# Patient Record
Sex: Female | Born: 1960 | Race: Black or African American | Hispanic: No | Marital: Married | Smoking: Never smoker
Health system: Southern US, Community
[De-identification: ages and names within clinical notes are randomized; demographics above are authoritative.]

## PROBLEM LIST (undated history)

## (undated) DIAGNOSIS — E785 Hyperlipidemia, unspecified: Secondary | ICD-10-CM

## (undated) DIAGNOSIS — E7800 Pure hypercholesterolemia, unspecified: Secondary | ICD-10-CM

## (undated) DIAGNOSIS — I10 Essential (primary) hypertension: Secondary | ICD-10-CM

## (undated) HISTORY — DX: Pure hypercholesterolemia, unspecified: E78.00

## (undated) HISTORY — PX: NASAL SEPTUM SURGERY: SHX37

## (undated) HISTORY — PX: BREAST BIOPSY: SHX20

## (undated) HISTORY — DX: Essential (primary) hypertension: I10

## (undated) HISTORY — DX: Hyperlipidemia, unspecified: E78.5

---

## 1998-06-14 ENCOUNTER — Other Ambulatory Visit: Admission: RE | Admit: 1998-06-14 | Discharge: 1998-06-14 | Payer: Self-pay | Admitting: Obstetrics and Gynecology

## 1998-06-17 ENCOUNTER — Other Ambulatory Visit: Admission: RE | Admit: 1998-06-17 | Discharge: 1998-06-17 | Payer: Self-pay | Admitting: Obstetrics and Gynecology

## 2000-01-20 ENCOUNTER — Encounter: Admission: RE | Admit: 2000-01-20 | Discharge: 2000-04-19 | Payer: Self-pay | Admitting: Internal Medicine

## 2000-02-09 ENCOUNTER — Other Ambulatory Visit: Admission: RE | Admit: 2000-02-09 | Discharge: 2000-02-09 | Payer: Self-pay | Admitting: Obstetrics and Gynecology

## 2000-03-31 ENCOUNTER — Ambulatory Visit (HOSPITAL_COMMUNITY): Admission: RE | Admit: 2000-03-31 | Discharge: 2000-03-31 | Payer: Self-pay | Admitting: Gastroenterology

## 2000-03-31 ENCOUNTER — Encounter (INDEPENDENT_AMBULATORY_CARE_PROVIDER_SITE_OTHER): Payer: Self-pay | Admitting: Specialist

## 2000-04-06 ENCOUNTER — Ambulatory Visit (HOSPITAL_COMMUNITY): Admission: RE | Admit: 2000-04-06 | Discharge: 2000-04-06 | Payer: Self-pay | Admitting: Gastroenterology

## 2000-04-06 ENCOUNTER — Encounter: Payer: Self-pay | Admitting: Gastroenterology

## 2000-05-20 ENCOUNTER — Encounter: Payer: Self-pay | Admitting: Obstetrics and Gynecology

## 2000-05-20 ENCOUNTER — Ambulatory Visit (HOSPITAL_COMMUNITY): Admission: RE | Admit: 2000-05-20 | Discharge: 2000-05-20 | Payer: Self-pay | Admitting: Obstetrics and Gynecology

## 2000-12-28 ENCOUNTER — Other Ambulatory Visit: Admission: RE | Admit: 2000-12-28 | Discharge: 2000-12-28 | Payer: Self-pay | Admitting: Obstetrics and Gynecology

## 2001-01-12 ENCOUNTER — Encounter: Payer: Self-pay | Admitting: Internal Medicine

## 2001-01-12 ENCOUNTER — Encounter: Admission: RE | Admit: 2001-01-12 | Discharge: 2001-01-12 | Payer: Self-pay | Admitting: Internal Medicine

## 2001-10-10 ENCOUNTER — Other Ambulatory Visit: Admission: RE | Admit: 2001-10-10 | Discharge: 2001-10-10 | Payer: Self-pay | Admitting: Obstetrics and Gynecology

## 2001-11-18 ENCOUNTER — Inpatient Hospital Stay (HOSPITAL_COMMUNITY): Admission: AD | Admit: 2001-11-18 | Discharge: 2001-11-18 | Payer: Self-pay | Admitting: Obstetrics and Gynecology

## 2001-11-18 ENCOUNTER — Encounter: Payer: Self-pay | Admitting: Obstetrics and Gynecology

## 2002-04-04 ENCOUNTER — Inpatient Hospital Stay (HOSPITAL_COMMUNITY): Admission: AD | Admit: 2002-04-04 | Discharge: 2002-04-06 | Payer: Self-pay | Admitting: Obstetrics & Gynecology

## 2002-04-06 ENCOUNTER — Encounter: Payer: Self-pay | Admitting: Obstetrics and Gynecology

## 2002-05-08 ENCOUNTER — Inpatient Hospital Stay (HOSPITAL_COMMUNITY): Admission: AD | Admit: 2002-05-08 | Discharge: 2002-05-11 | Payer: Self-pay | Admitting: *Deleted

## 2002-05-16 ENCOUNTER — Encounter: Admission: RE | Admit: 2002-05-16 | Discharge: 2002-06-15 | Payer: Self-pay | Admitting: Obstetrics and Gynecology

## 2002-06-15 ENCOUNTER — Other Ambulatory Visit: Admission: RE | Admit: 2002-06-15 | Discharge: 2002-06-15 | Payer: Self-pay | Admitting: Obstetrics and Gynecology

## 2002-07-16 ENCOUNTER — Encounter: Admission: RE | Admit: 2002-07-16 | Discharge: 2002-08-15 | Payer: Self-pay | Admitting: Obstetrics and Gynecology

## 2002-08-16 ENCOUNTER — Encounter: Admission: RE | Admit: 2002-08-16 | Discharge: 2002-09-15 | Payer: Self-pay | Admitting: Obstetrics and Gynecology

## 2002-10-15 ENCOUNTER — Encounter: Admission: RE | Admit: 2002-10-15 | Discharge: 2002-11-14 | Payer: Self-pay | Admitting: Obstetrics and Gynecology

## 2002-12-15 ENCOUNTER — Encounter: Admission: RE | Admit: 2002-12-15 | Discharge: 2003-01-14 | Payer: Self-pay | Admitting: Obstetrics and Gynecology

## 2003-01-12 ENCOUNTER — Encounter: Payer: Self-pay | Admitting: Internal Medicine

## 2003-01-12 ENCOUNTER — Encounter: Admission: RE | Admit: 2003-01-12 | Discharge: 2003-01-12 | Payer: Self-pay | Admitting: Internal Medicine

## 2003-02-14 ENCOUNTER — Encounter: Admission: RE | Admit: 2003-02-14 | Discharge: 2003-03-16 | Payer: Self-pay | Admitting: Obstetrics and Gynecology

## 2003-09-14 ENCOUNTER — Other Ambulatory Visit: Admission: RE | Admit: 2003-09-14 | Discharge: 2003-09-14 | Payer: Self-pay | Admitting: Obstetrics and Gynecology

## 2003-10-12 ENCOUNTER — Ambulatory Visit (HOSPITAL_COMMUNITY): Admission: RE | Admit: 2003-10-12 | Discharge: 2003-10-12 | Payer: Self-pay | Admitting: Obstetrics and Gynecology

## 2004-10-29 ENCOUNTER — Ambulatory Visit (HOSPITAL_COMMUNITY): Admission: RE | Admit: 2004-10-29 | Discharge: 2004-10-29 | Payer: Self-pay | Admitting: Internal Medicine

## 2004-11-05 ENCOUNTER — Encounter: Admission: RE | Admit: 2004-11-05 | Discharge: 2004-11-05 | Payer: Self-pay | Admitting: Obstetrics and Gynecology

## 2005-05-06 ENCOUNTER — Encounter: Admission: RE | Admit: 2005-05-06 | Discharge: 2005-05-06 | Payer: Self-pay | Admitting: Obstetrics and Gynecology

## 2005-11-04 ENCOUNTER — Encounter: Admission: RE | Admit: 2005-11-04 | Discharge: 2005-11-04 | Payer: Self-pay | Admitting: Obstetrics and Gynecology

## 2005-12-03 ENCOUNTER — Encounter: Admission: RE | Admit: 2005-12-03 | Discharge: 2005-12-03 | Payer: Self-pay | Admitting: Obstetrics and Gynecology

## 2006-04-15 ENCOUNTER — Ambulatory Visit (HOSPITAL_COMMUNITY): Admission: RE | Admit: 2006-04-15 | Discharge: 2006-04-15 | Payer: Self-pay | Admitting: Orthopedic Surgery

## 2007-01-06 ENCOUNTER — Encounter: Admission: RE | Admit: 2007-01-06 | Discharge: 2007-01-06 | Payer: Self-pay | Admitting: Obstetrics and Gynecology

## 2008-01-19 ENCOUNTER — Encounter: Admission: RE | Admit: 2008-01-19 | Discharge: 2008-01-19 | Payer: Self-pay | Admitting: Internal Medicine

## 2008-01-30 ENCOUNTER — Encounter: Admission: RE | Admit: 2008-01-30 | Discharge: 2008-01-30 | Payer: Self-pay | Admitting: Internal Medicine

## 2008-08-22 ENCOUNTER — Encounter: Admission: RE | Admit: 2008-08-22 | Discharge: 2008-08-22 | Payer: Self-pay | Admitting: Internal Medicine

## 2009-01-07 ENCOUNTER — Encounter: Admission: RE | Admit: 2009-01-07 | Discharge: 2009-01-07 | Payer: Self-pay | Admitting: Internal Medicine

## 2009-12-31 ENCOUNTER — Ambulatory Visit (HOSPITAL_COMMUNITY): Admission: RE | Admit: 2009-12-31 | Discharge: 2009-12-31 | Payer: Self-pay | Admitting: Internal Medicine

## 2010-01-09 ENCOUNTER — Encounter: Admission: RE | Admit: 2010-01-09 | Discharge: 2010-01-09 | Payer: Self-pay | Admitting: Internal Medicine

## 2010-07-13 HISTORY — PX: SALIVARY GLAND SURGERY: SHX768

## 2010-11-15 ENCOUNTER — Inpatient Hospital Stay (INDEPENDENT_AMBULATORY_CARE_PROVIDER_SITE_OTHER)
Admission: RE | Admit: 2010-11-15 | Discharge: 2010-11-15 | Disposition: A | Discharge status: Home / Self Care | Payer: BC Managed Care – PPO | Source: Ambulatory Visit | Attending: Family Medicine | Admitting: Family Medicine

## 2010-11-15 DIAGNOSIS — L259 Unspecified contact dermatitis, unspecified cause: Secondary | ICD-10-CM

## 2010-11-28 NOTE — Discharge Summary (Signed)
NAMEVERA, FURNISS                        ACCOUNT NO.:  [id number]   MEDICAL RECORD NO.:  [id number]                   PATIENT TYPE:  INP   LOCATION:  9154                                 FACILITY:  [facility]   PHYSICIAN:  Maxie Better, MD              DATE OF BIRTH:  09-16-1960   DATE OF ADMISSION:  04/04/2002  DATE OF DISCHARGE:  04/06/2002                                 DISCHARGE SUMMARY   ADMISSION DIAGNOSES:  1. Oligohydramnios.  2. Chronic hypertension.  3. Intrauterine gestation at 41 and five-sevenths weeks.   DISCHARGE DIAGNOSES:  1. Oligohydramnios, resolved.  2. Chronic hypertension.  3. Intrauterine gestation at 35 weeks.   HISTORY OF PRESENT ILLNESS:  A 50 year old gravida 1 para 0 female at 75 and  five-sevenths weeks gestation by last menstrual period and ultrasound who is  now at 34 and five-sevenths weeks gestation admitted for aggressive IV  hydration secondary to oligohydramnios.  The patient had had no leakage of  fluid.  She has had good fetal movements, no contractions.  Chronic  hypertension has been controlled with Aldomet.  Ultrasound done secondary to  her hypertension on April 04, 2002 showed amniotic fluid index of 5.9  with was less than the third percentile, estimated fetal weight of 2173 g.  Biophysical profile was 6/8, -2 for fluid.  Prenatal course was notable for  fetal pyelectasis which had subsequently resolved on ultrasound done on  March 10, 2002.  The patient had declined AFP3 testing as well as  amniocentesis.  The patient is known to have a posterior left 6.1 x 6.1 cm  fibroid.   HOSPITAL COURSE:  The patient was admitted.  Her exam was unremarkable.  Her  blood pressure on admission was 107/76, cervix was long and closed,  presenting part was high.  Her group B strep culture was done.  Tracing  showed a baseline fetal heart rate of 160 with accelerations, no  contractions.  She was started on IV fluids, placed on continuous  fetal  monitoring.  She was continued on her Aldomet.  Her group B strep culture  was unavailable.  The patient had a subsequent reactive nonstress test.  On  hospital day #3 she underwent a repeat ultrasound that showed an aminiotic  fluid index of 10.8 which was normal.  Therefore, the patient was discharged  home.   DISPOSITION:  Home; condition stable.   DISCHARGE MEDICATIONS:  1. Continue Aldomet 250 p.o. b.i.d.  2. Prenatal vitamins one p.o. q.d.  3. Zantac 150 mg one p.o. b.i.d.   FOLLOW-UP:  As previously scheduled appointment.    DISCHARGE INSTRUCTIONS:  Call for leakage of fluid, decreased fetal  movement, preterm labor precautions, and vaginal bleeding.  Maxie Better, MD    [place]/MEDQ  D:  04/30/2002  T:  05/01/2002  Job:  [id number]

## 2010-11-28 NOTE — Discharge Summary (Signed)
NAMEVICKEE, Theresa Rhodes                        ACCOUNT NO.:  [id number]   MEDICAL RECORD NO.:  [id number]                   PATIENT TYPE:  INP   LOCATION:  9127                                 [facility]   PHYSICIAN:  Maxie Better, M.D.            DATE OF BIRTH:  02/28/1961   DATE OF ADMISSION:  05/08/2002  DATE OF DISCHARGE:  05/11/2002                                 DISCHARGE SUMMARY   ADMISSION DIAGNOSES:  1. Chronic hypertension.  2. Term gestation.   DISCHARGE DIAGNOSES:  1. Term gestation, delivered.  2. Chronic hypertension.   HISTORY OF PRESENT ILLNESS:  This is a 50 year old gravida 1, para 0, female  with chronic hypertension, admitted for induction of labor, secondary to her  hypertension.  The patient is well-controlled on Aldomet.  Her prenatal care  was complicated by a 48-hour admission for IV hydration, secondary to  oligohydramnios which subsequently resolved.  She had been followed closely  with intrapartum fetal surveillance with non-stress test.   HOSPITAL COURSE:  The patient was admitted, examination was done, and she  had a reactive non-stress test.  The cervix was closed, soft, vertex, -3.  She was noted to be Group B Strep culture positive.  She was continued on  her Aldomet.  Cervidil was placed on 05/08/02, for cervical ripening, and on  the morning of 05/09/02, after removal of the Cervidil, the patient was  found to be 1.5 cm, 60 to 70%, -2, vertex presentation.  Penicillin  prophylaxis was started, as was Pitocin.  The patient subsequently received  an epidural for pain management.  Artifical rupture of membranes was  performed around 3:15 p.m., clear fluid was noted.  Intrauterine pressure  catheter and internal scalp electrodes were placed.  Variable decelerations  were noted with contractions.  Pitocin was discontinued.  Amnio infusion was  started.  The variables were thought to be secondary to cord compression.  She subsequently  progressed quickly with the pushing resulted in variable  decelerations down to the 60's with each contraction.  She was fully dilated  posterior station.  Vacuum was applied for assistance in delivery.  This  resulted in the delivery of a 5 pound 15 ounce live female over a second  degree perineal laceration with a loop of  cord around the right leg.  Apgar's of 9 and 9.  Cord pH was done.  The postoperative course was  unremarkable.  CBC on postpartum day #1, showed a hematocrit of 35.3,  hemoglobin of 12, white blood cell count of 10.9, platelets of 169,000.  The  patient's blood type is A positive.  She was rubella immune.   DISPOSITION:  Home.   CONDITION ON DISCHARGE:  Stable.   DISCHARGE MEDICATIONS:  1. Aldomet 250 mg p.o. b.i.d.  2. Prenatal vitamins one p.o. q.d.  3. Non-steroidal's p.r.n.   FOLLOWUP:  Four weeks postpartum at [facility].   DISCHARGE INSTRUCTIONS:  1. Call for temperature greater then or equal to 100.4.  2. Nothing per vagina for four to six weeks.  3. Call for preeclampsia warning signs, soaking a regular pad over a hour     more frequently.  4. Continue prenatal vitamins.                                                 Maxie Better, M.D.    [place]/MEDQ  D:  05/29/2002  T:  05/30/2002  Job:  [id number]

## 2010-11-28 NOTE — Procedures (Signed)
[facility]  Patient:    Theresa Rhodes, Theresa Rhodes               MRN: [id number] Proc. Date: 03/31/00 Adm. Date:  [id number] Disc. Date: [id number] Attending:  Charna Elizabeth CC:         Norva Pavlov, M.D.   Procedure Report  DATE OF BIRTH:  February 25, 1958  PROCEDURE PERFORMED:  Esophagogastroduodenoscopy with biopsies.  ENDOSCOPIST:  Anselmo Rod, M.D.  INSTRUMENT USED:  Olympus video panendoscope.  INDICATIONS FOR PROCEDURE:  Epigastric pain in a 50 year old female, rule out peptic ulcer disease.  PREPROCEDURE PREPARATION:  Informed consent was procured from the patient. The patient was fasted for eight hours prior to the procedure.  PREPROCEDURE PHYSICAL:  VITAL SIGNS:  Stable.  NECK:  Supple.  CHEST:  Clear to auscultation, S1 and S2 regular.  No murmur, rub, gallops, rales, rhonchi, or wheezing.  ABDOMEN:  Soft with normal abdominal bowel sounds.  DESCRIPTION OF PROCEDURE:  The patient was placed in the left lateral decubitus position, and sedated with 30 mg of Demerol and 5 mg of Versed intravenously.  Once the patient was adequately sedated, maintained on low flow oxygen and continuous cardiac monitoring, the Olympus video panendoscope was advanced through the mouth piece over the tongue into the esophagus under direct vision.  The entire esophagus appeared normal without evidence of ring stricture, masses, lesions, esophagitis, or Barretts mucosa.  The scope was then advanced to the stomach.  There was mild diffuse gastritis throughout the gastric mucosa.  No frank ulcers, erosions, masses, or polyps were seen. Biopsies were done from the antrum to rule out the presence of Helicobacter pylori by pathology.  The duodenal bulb and the small bowel distal to the bulb up to 70 cm appeared normal.  There was no outlet obstruction.  IMPRESSION:  Normal esophagogastroduodenoscopy except for mild diffuse gastritis, biopsies done  for Helicobacter pylori.  RECOMMENDATION: 1. Proceed with abdominal ultrasound and HIDA scan. 2. Treat with antibiotics if H. pylori present on biopsies. 3. Outpatient follow up in the next two weeks. DD:  04/01/00 TD:  04/03/00 Job: [id number]

## 2011-02-02 ENCOUNTER — Other Ambulatory Visit: Payer: Self-pay | Admitting: Obstetrics and Gynecology

## 2011-02-02 DIAGNOSIS — Z1231 Encounter for screening mammogram for malignant neoplasm of breast: Secondary | ICD-10-CM

## 2011-02-04 ENCOUNTER — Ambulatory Visit
Admission: RE | Admit: 2011-02-04 | Discharge: 2011-02-04 | Disposition: A | Discharge status: Home / Self Care | Payer: BC Managed Care – PPO | Source: Ambulatory Visit | Attending: Obstetrics and Gynecology | Admitting: Obstetrics and Gynecology

## 2011-02-04 DIAGNOSIS — Z1231 Encounter for screening mammogram for malignant neoplasm of breast: Secondary | ICD-10-CM

## 2011-04-30 ENCOUNTER — Ambulatory Visit (HOSPITAL_BASED_OUTPATIENT_CLINIC_OR_DEPARTMENT_OTHER)
Admission: RE | Admit: 2011-04-30 | Discharge: 2011-05-01 | Disposition: A | Discharge status: Home / Self Care | Payer: BC Managed Care – PPO | Source: Ambulatory Visit | Attending: Otolaryngology | Admitting: Otolaryngology

## 2011-04-30 DIAGNOSIS — K115 Sialolithiasis: Secondary | ICD-10-CM | POA: Insufficient documentation

## 2011-05-07 NOTE — Op Note (Signed)
  [name], [name]              ACCOUNT NO.:  [id number]  MEDICAL RECORD NO.:  [id number]  LOCATION:                                 FACILITY:  PHYSICIAN:  Theresa Morrish H. Pollyann Kennedy, MD     DATE OF BIRTH:  1961/06/17  DATE OF PROCEDURE:  04/30/2011 DATE OF DISCHARGE:                              OPERATIVE REPORT   PREOPERATIVE DIAGNOSIS:  Right submandibular gland calculus or calculi.  POSTOPERATIVE DIAGNOSIS:  Right submandibular gland calculus or calculi.  PROCEDURE:  Transoral sialolithotomy.  SURGEON:  Kaliyan Osbourn H. Pollyann Kennedy, MD  ASSISTANT:  Aquilla Hacker, PA-C  ANESTHESIA:  General endotracheal anesthesia was used.  COMPLICATIONS:  None.  ESTIMATED BLOOD LOSS:  Minimal.  FINDINGS:  A small amount of purulent salivary contents in the distal right submandibular duct.  Two large stones, largest one about 1 cm in greater dimension, smaller one slightly smaller.  Blood contained within the hilum of the right submandibular gland.  No other abnormalities.  HISTORY:  This is a 50 year old with a several year history of intermittent right submandibular swelling.  She was seen on preop CT imaging to have 2 large calculi in the hilum of the gland on the right side.  Risks, benefits, alternatives, and complications of the procedure were explained to the patient, seemed to understand, and agreed to surgery.  PROCEDURE IN DETAIL:  The patient was taken to the operating room and placed on the operating table in supine position.  Following induction of general endotracheal anesthesia, the patient was draped in a standard fashion.  A left bite block was used throughout the case.  Xylocaine 1% with epinephrine was infiltrated into the right floor of the mouth.  The right submandibular duct puncta was cannulated with a lacrimal probe and then serially dilated up to the largest probe.  The duct was then opened all the way back along the course of the floor of mouth.  Using a combination of  retractors and a long nasal speculum as well as bimanual palpation, the calculi were identified first by palpation and ultimately by visualization and then removed.  There was no backed up saliva or any other secretions behind the stones.  The stones were surrounded by a dense fibrotic reaction.  Two neural structures, presumably the lingual nerve and possibly the submandibular ganglion were identified and were preserved.  There were some floor of mouth veins that were ligated with silk suture.  Cautery was used for the remainder of hemostasis.  The facial artery was not identified.  No other neurovascular structures were identified during the dissection.  The superior aspect of the incision was reapproximated with chromic suture and the remainder of the incision was left open.  The floor of mouth and the wounds were irrigated with saline and suctioned. The patient was awakened, extubated, and transferred to recovery in stable condition.     [name], MD     [facility]/MEDQ  D:  04/30/2011  T:  04/30/2011  Job:  [id number]  Electronically Signed by Serena Colonel MD on 05/07/2011 07:07:54 PM

## 2011-08-27 ENCOUNTER — Other Ambulatory Visit: Payer: Self-pay | Admitting: Internal Medicine

## 2011-08-27 DIAGNOSIS — N6310 Unspecified lump in the right breast, unspecified quadrant: Secondary | ICD-10-CM

## 2011-09-09 ENCOUNTER — Ambulatory Visit
Admission: RE | Admit: 2011-09-09 | Discharge: 2011-09-09 | Disposition: A | Discharge status: Home / Self Care | Payer: BC Managed Care – PPO | Source: Ambulatory Visit | Attending: Internal Medicine | Admitting: Internal Medicine

## 2011-09-09 ENCOUNTER — Ambulatory Visit: Payer: BC Managed Care – PPO

## 2011-09-09 ENCOUNTER — Other Ambulatory Visit: Payer: Self-pay | Admitting: Internal Medicine

## 2011-09-09 DIAGNOSIS — N6310 Unspecified lump in the right breast, unspecified quadrant: Secondary | ICD-10-CM

## 2011-09-09 DIAGNOSIS — R05 Cough: Secondary | ICD-10-CM

## 2011-09-09 DIAGNOSIS — R059 Cough, unspecified: Secondary | ICD-10-CM

## 2012-02-22 ENCOUNTER — Other Ambulatory Visit: Payer: Self-pay | Admitting: Internal Medicine

## 2012-02-22 DIAGNOSIS — Z1231 Encounter for screening mammogram for malignant neoplasm of breast: Secondary | ICD-10-CM

## 2012-03-03 ENCOUNTER — Ambulatory Visit
Admission: RE | Admit: 2012-03-03 | Discharge: 2012-03-03 | Disposition: A | Discharge status: Home / Self Care | Payer: BC Managed Care – PPO | Source: Ambulatory Visit | Attending: Internal Medicine | Admitting: Internal Medicine

## 2012-03-03 DIAGNOSIS — Z1231 Encounter for screening mammogram for malignant neoplasm of breast: Secondary | ICD-10-CM

## 2013-05-03 ENCOUNTER — Other Ambulatory Visit: Payer: Self-pay

## 2013-05-03 DIAGNOSIS — Z1231 Encounter for screening mammogram for malignant neoplasm of breast: Secondary | ICD-10-CM

## 2013-05-23 ENCOUNTER — Ambulatory Visit
Admission: RE | Admit: 2013-05-23 | Discharge: 2013-05-23 | Disposition: A | Discharge status: Home / Self Care | Payer: BC Managed Care – PPO | Source: Ambulatory Visit

## 2013-05-23 DIAGNOSIS — Z1231 Encounter for screening mammogram for malignant neoplasm of breast: Secondary | ICD-10-CM

## 2014-04-25 ENCOUNTER — Other Ambulatory Visit: Payer: Self-pay | Admitting: Internal Medicine

## 2014-04-25 ENCOUNTER — Ambulatory Visit
Admission: RE | Admit: 2014-04-25 | Discharge: 2014-04-25 | Disposition: A | Discharge status: Home / Self Care | Payer: BC Managed Care – PPO | Source: Ambulatory Visit | Attending: Internal Medicine | Admitting: Internal Medicine

## 2014-04-25 DIAGNOSIS — R05 Cough: Secondary | ICD-10-CM

## 2014-04-25 DIAGNOSIS — R059 Cough, unspecified: Secondary | ICD-10-CM

## 2014-07-19 ENCOUNTER — Other Ambulatory Visit: Payer: Self-pay

## 2014-07-19 DIAGNOSIS — Z1231 Encounter for screening mammogram for malignant neoplasm of breast: Secondary | ICD-10-CM

## 2014-07-20 ENCOUNTER — Ambulatory Visit
Admission: RE | Admit: 2014-07-20 | Discharge: 2014-07-20 | Disposition: A | Discharge status: Home / Self Care | Payer: BLUE CROSS/BLUE SHIELD | Source: Ambulatory Visit

## 2014-07-20 ENCOUNTER — Ambulatory Visit: Admission: RE | Admit: 2014-07-20 | Payer: BLUE CROSS/BLUE SHIELD | Source: Ambulatory Visit

## 2014-07-20 DIAGNOSIS — Z1231 Encounter for screening mammogram for malignant neoplasm of breast: Secondary | ICD-10-CM

## 2015-10-25 ENCOUNTER — Other Ambulatory Visit: Payer: Self-pay

## 2015-10-25 ENCOUNTER — Other Ambulatory Visit: Payer: Self-pay | Admitting: Surgery

## 2015-10-25 DIAGNOSIS — Z1231 Encounter for screening mammogram for malignant neoplasm of breast: Secondary | ICD-10-CM

## 2015-11-05 ENCOUNTER — Ambulatory Visit
Admission: RE | Admit: 2015-11-05 | Discharge: 2015-11-05 | Disposition: A | Discharge status: Home / Self Care | Payer: BLUE CROSS/BLUE SHIELD | Source: Ambulatory Visit

## 2015-11-05 DIAGNOSIS — Z1231 Encounter for screening mammogram for malignant neoplasm of breast: Secondary | ICD-10-CM

## 2015-11-05 DIAGNOSIS — M94262 Chondromalacia, left knee: Secondary | ICD-10-CM | POA: Diagnosis not present

## 2015-11-05 DIAGNOSIS — M94261 Chondromalacia, right knee: Secondary | ICD-10-CM | POA: Diagnosis not present

## 2015-11-20 DIAGNOSIS — M94261 Chondromalacia, right knee: Secondary | ICD-10-CM | POA: Diagnosis not present

## 2015-11-20 DIAGNOSIS — M94262 Chondromalacia, left knee: Secondary | ICD-10-CM | POA: Diagnosis not present

## 2015-12-20 DIAGNOSIS — M94261 Chondromalacia, right knee: Secondary | ICD-10-CM | POA: Diagnosis not present

## 2015-12-20 DIAGNOSIS — M94262 Chondromalacia, left knee: Secondary | ICD-10-CM | POA: Diagnosis not present

## 2016-01-29 DIAGNOSIS — M94262 Chondromalacia, left knee: Secondary | ICD-10-CM | POA: Diagnosis not present

## 2016-01-29 DIAGNOSIS — M94261 Chondromalacia, right knee: Secondary | ICD-10-CM | POA: Diagnosis not present

## 2016-02-27 DIAGNOSIS — M94261 Chondromalacia, right knee: Secondary | ICD-10-CM | POA: Diagnosis not present

## 2016-02-27 DIAGNOSIS — M94262 Chondromalacia, left knee: Secondary | ICD-10-CM | POA: Diagnosis not present

## 2016-03-26 DIAGNOSIS — Z01419 Encounter for gynecological examination (general) (routine) without abnormal findings: Secondary | ICD-10-CM | POA: Diagnosis not present

## 2016-03-26 DIAGNOSIS — Z1151 Encounter for screening for human papillomavirus (HPV): Secondary | ICD-10-CM | POA: Diagnosis not present

## 2016-03-26 DIAGNOSIS — Z6831 Body mass index (BMI) 31.0-31.9, adult: Secondary | ICD-10-CM | POA: Diagnosis not present

## 2016-04-17 DIAGNOSIS — R319 Hematuria, unspecified: Secondary | ICD-10-CM | POA: Diagnosis not present

## 2016-04-22 ENCOUNTER — Ambulatory Visit (INDEPENDENT_AMBULATORY_CARE_PROVIDER_SITE_OTHER): Payer: BLUE CROSS/BLUE SHIELD | Admitting: Interventional Cardiology

## 2016-04-22 ENCOUNTER — Encounter: Payer: Self-pay | Admitting: Interventional Cardiology

## 2016-04-22 ENCOUNTER — Encounter (INDEPENDENT_AMBULATORY_CARE_PROVIDER_SITE_OTHER): Payer: Self-pay

## 2016-04-22 VITALS — BP 134/90 | HR 75 | Ht 65.0 in | Wt 182.0 lb

## 2016-04-22 DIAGNOSIS — E785 Hyperlipidemia, unspecified: Secondary | ICD-10-CM | POA: Diagnosis not present

## 2016-04-22 DIAGNOSIS — Z823 Family history of stroke: Secondary | ICD-10-CM

## 2016-04-22 DIAGNOSIS — I10 Essential (primary) hypertension: Secondary | ICD-10-CM | POA: Diagnosis not present

## 2016-04-22 LAB — LIPID PANEL
CHOLESTEROL: 268 mg/dL — AB (ref 125–200)
HDL: 64 mg/dL (ref >=46)
LDL Cholesterol: 179 mg/dL — ABNORMAL HIGH (ref <130)
Total CHOL/HDL Ratio: 4.2 Ratio (ref <=5.0)
Triglycerides: 124 mg/dL (ref <150)
VLDL: 25 mg/dL (ref <30)

## 2016-04-22 MED ORDER — ASPIRIN EC 81 MG PO TBEC: 81.0000 mg | DELAYED_RELEASE_TABLET | Freq: Every day | ORAL | 3 refills | Status: DC

## 2016-04-22 NOTE — Patient Instructions (Signed)
Medication Instructions:  1) START Aspirin 81mg  once daily  Labwork: Lipid panel today  Testing/Procedures: Your physician has requested that you have a carotid duplex. This test is an ultrasound of the carotid arteries in your neck. It looks at blood flow through these arteries that supply the brain with blood. Allow one hour for this exam. There are no restrictions or special instructions.    Follow-Up: Your physician recommends that you schedule a follow-up appointment as needed with Dr. Katrinka BlazingSmith.   Any Other Special Instructions Will Be Listed Below (If Applicable).     If you need a refill on your cardiac medications before your next appointment, please call your pharmacy.

## 2016-04-22 NOTE — Progress Notes (Signed)
Cardiology Office Note    Date:  04/22/2016   ID:  Theresa Rhodes, DOB 1960-07-26, MRN [id number]  PCP:  Gwynneth AlimentSANDERS,ROBYN N, MD  Cardiologist: Lesleigh NoeHenry W Smith III, MD   Chief Complaint  Patient presents with  . 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  She is asymptomatic. She denies palpitations. No chest discomfort. She denies orthopnea. Is no lower extremity edema. She has had intermittent headaches. She is on antihypertensive therapy. She measures her blood pressure from time to time at work and feels that it is controlled usually with values that are 130/85 mmHg or less. She is not exercising on a regular basis. She has gained weight.  Recently her nephew had complete occlusion of the left internal carotid with result in a fascia and Sherilyn CooterHenry since. He was a smoker with hypertension.   Past Medical History:  Diagnosis Date  . Essential hypertension, benign   . Hypercholesterolemia    pure  . Hyperlipidemia     No past surgical history on file.  Current Medications: No outpatient prescriptions prior to visit.   No facility-administered medications prior to visit.      Allergies:   Review of patient's allergies indicates no known allergies.   Social History   Social History  . Marital status: Married    Spouse name: N/A  . Number of children: N/A  . Years of education: N/A   Social History Main Topics  . Smoking status: Never Smoker  . Smokeless tobacco: Never Used  . Alcohol use None  . Drug use: Unknown  . Sexual activity: Not Asked   Other Topics Concern  . None   Social History Narrative  . None     Family History:  The patient's family history includes  Atrial fibrillation in her mother; COPD in her father; Heart attack in her mother; Heart disease in her mother; Hypertension in her father; Stroke in her mother.   ROS:   Please see the history of present illness.    Bilateral knee and lower back discomfort for which she uses Advil.  All other systems reviewed and are negative.   PHYSICAL EXAM:   VS:  BP 134/90   Pulse 75   Ht 5\' 5"  (1.651 m)   Wt 182 lb (82.6 kg)   LMP 11/05/2010   BMI 30.29 kg/m    GEN: Well nourished, well developed, in no acute distress . Slightly overweight. HEENT: normal  Neck: no JVD, carotid bruits, or masses Cardiac: RRR; no murmurs, rubs, or gallops,no edema  Respiratory:  clear to auscultation bilaterally, normal work of breathing GI: soft, nontender, nondistended, + BS MS: no deformity or atrophy  Skin: warm and dry, no rash Neuro:  Alert and Oriented x 3, Strength and sensation are intact Psych: euthymic mood, full affect  Wt Readings from Last 3 Encounters:  04/22/16 182 lb (82.6 kg)      Studies/Labs Reviewed:   EKG:  EKG  Normal sinus rhythm, heart rate 75 bpm, normal tracing.  Recent Labs: No results found for requested labs within last 8760 hours.   Lipid Panel No results found for: CHOL, TRIG, HDL, CHOLHDL, VLDL, LDLCALC, LDLDIRECT  Additional studies/ records that were reviewed today include:  Possible  prior history of coronary calcium score done at the [facility] several years ago that was "negative for calcium". Prior [place] heart studies. She will provide data.    ASSESSMENT:    1. Essential hypertension   2. Hyperlipidemia, unspecified hyperlipidemia type   3. Family history of stroke      PLAN:  In order of problems listed above:  1. Control is at the upper limit of target. We will to clearly be under 130/85 mmHg. I have encouraged monitoring sodium in the diet, aerobic activity, and weight loss. If pressures continue to run at the upper limit of target we may  consider intensification of therapy. She is anti-ACE therapy. 2. No recent data. We'll do a lipid panel today. Bilateral carotid Dopplers being performed to rule out plaque has evidence of generalized atherosclerosis which will require more aggressive lipid management. Currently because of an elevated HDL, no therapy has been advised. 3. CVA and MI in first-line relatives causes anxiety related to the possibility of vascular events. Today's exam is normal. No bruits are heard. Blood pressure is borderline. A lipid panel is being done. Carotid Dopplers being done to look for evidence of plaque buildup. This information will be used to help determine if more aggressive management of lipids is indicated.    Medication Adjustments/Labs and Tests Ordered: Current medicines are reviewed at length with the patient today.  Concerns regarding medicines are outlined above.  Medication changes, Labs and Tests ordered today are listed in the Patient Instructions below. Patient Instructions  Medication Instructions:  1) START Aspirin 81mg  once daily  Labwork: Lipid panel today  Testing/Procedures: Your physician has requested that you have a carotid duplex. This test is an ultrasound of the carotid arteries in your neck. It looks at blood flow through these arteries that supply the brain with blood. Allow one hour for this exam. There are no restrictions or special instructions.    Follow-Up: Your physician recommends that you schedule a follow-up appointment as needed with Dr. Katrinka Blazing.   Any Other Special Instructions Will Be Listed Below (If Applicable).     If you need a refill on your cardiac medications before your next appointment, please call your pharmacy.      Signed, Lesleigh Noe, MD  04/22/2016 11:42 AM    [facility] [address] [place], [place], [place]  [address] Phone: [phone]; Fax: [phone]

## 2016-04-27 ENCOUNTER — Ambulatory Visit (HOSPITAL_COMMUNITY)
Admission: RE | Admit: 2016-04-27 | Discharge: 2016-04-27 | Disposition: A | Discharge status: Home / Self Care | Payer: BLUE CROSS/BLUE SHIELD | Source: Ambulatory Visit | Attending: Internal Medicine | Admitting: Internal Medicine

## 2016-04-27 ENCOUNTER — Ambulatory Visit (HOSPITAL_COMMUNITY): Payer: BLUE CROSS/BLUE SHIELD

## 2016-04-27 DIAGNOSIS — Z823 Family history of stroke: Secondary | ICD-10-CM | POA: Diagnosis not present

## 2016-04-27 DIAGNOSIS — E785 Hyperlipidemia, unspecified: Secondary | ICD-10-CM | POA: Diagnosis not present

## 2016-04-27 DIAGNOSIS — I10 Essential (primary) hypertension: Secondary | ICD-10-CM | POA: Insufficient documentation

## 2016-12-10 ENCOUNTER — Other Ambulatory Visit: Payer: Self-pay | Admitting: Internal Medicine

## 2016-12-10 DIAGNOSIS — Z1231 Encounter for screening mammogram for malignant neoplasm of breast: Secondary | ICD-10-CM

## 2016-12-11 ENCOUNTER — Ambulatory Visit
Admission: RE | Admit: 2016-12-11 | Discharge: 2016-12-11 | Disposition: A | Discharge status: Home / Self Care | Payer: 59 | Source: Ambulatory Visit | Attending: Internal Medicine | Admitting: Internal Medicine

## 2016-12-11 DIAGNOSIS — Z1231 Encounter for screening mammogram for malignant neoplasm of breast: Secondary | ICD-10-CM

## 2017-06-28 DIAGNOSIS — M79644 Pain in right finger(s): Secondary | ICD-10-CM | POA: Diagnosis not present

## 2017-07-20 DIAGNOSIS — I10 Essential (primary) hypertension: Secondary | ICD-10-CM | POA: Diagnosis not present

## 2017-07-20 DIAGNOSIS — Z0000 Encounter for general adult medical examination without abnormal findings: Secondary | ICD-10-CM | POA: Diagnosis not present

## 2017-10-01 DIAGNOSIS — R05 Cough: Secondary | ICD-10-CM | POA: Diagnosis not present

## 2017-12-14 DIAGNOSIS — M545 Low back pain: Secondary | ICD-10-CM | POA: Diagnosis not present

## 2017-12-14 DIAGNOSIS — M542 Cervicalgia: Secondary | ICD-10-CM | POA: Diagnosis not present

## 2018-02-01 DIAGNOSIS — I10 Essential (primary) hypertension: Secondary | ICD-10-CM | POA: Diagnosis not present

## 2018-02-01 DIAGNOSIS — R7309 Other abnormal glucose: Secondary | ICD-10-CM | POA: Diagnosis not present

## 2018-02-01 DIAGNOSIS — E669 Obesity, unspecified: Secondary | ICD-10-CM | POA: Diagnosis not present

## 2018-02-01 DIAGNOSIS — Z6832 Body mass index (BMI) 32.0-32.9, adult: Secondary | ICD-10-CM | POA: Diagnosis not present

## 2018-02-10 DIAGNOSIS — M545 Low back pain: Secondary | ICD-10-CM | POA: Diagnosis not present

## 2018-02-10 DIAGNOSIS — M542 Cervicalgia: Secondary | ICD-10-CM | POA: Diagnosis not present

## 2018-02-10 DIAGNOSIS — I10 Essential (primary) hypertension: Secondary | ICD-10-CM | POA: Diagnosis not present

## 2018-02-10 DIAGNOSIS — R7309 Other abnormal glucose: Secondary | ICD-10-CM | POA: Diagnosis not present

## 2018-02-10 DIAGNOSIS — M47812 Spondylosis without myelopathy or radiculopathy, cervical region: Secondary | ICD-10-CM | POA: Diagnosis not present

## 2018-02-10 DIAGNOSIS — M47816 Spondylosis without myelopathy or radiculopathy, lumbar region: Secondary | ICD-10-CM | POA: Diagnosis not present

## 2018-05-12 DIAGNOSIS — H52223 Regular astigmatism, bilateral: Secondary | ICD-10-CM | POA: Diagnosis not present

## 2018-05-12 DIAGNOSIS — H5213 Myopia, bilateral: Secondary | ICD-10-CM | POA: Diagnosis not present

## 2018-05-12 DIAGNOSIS — H538 Other visual disturbances: Secondary | ICD-10-CM | POA: Diagnosis not present

## 2018-05-12 DIAGNOSIS — H40003 Preglaucoma, unspecified, bilateral: Secondary | ICD-10-CM | POA: Diagnosis not present

## 2018-05-24 ENCOUNTER — Other Ambulatory Visit: Payer: Self-pay | Admitting: Internal Medicine

## 2018-05-24 DIAGNOSIS — Z1231 Encounter for screening mammogram for malignant neoplasm of breast: Secondary | ICD-10-CM

## 2018-06-22 DIAGNOSIS — R300 Dysuria: Secondary | ICD-10-CM | POA: Diagnosis not present

## 2018-07-07 ENCOUNTER — Ambulatory Visit
Admission: RE | Admit: 2018-07-07 | Discharge: 2018-07-07 | Disposition: A | Discharge status: Home / Self Care | Payer: BLUE CROSS/BLUE SHIELD | Source: Ambulatory Visit | Attending: Internal Medicine | Admitting: Internal Medicine

## 2018-07-07 ENCOUNTER — Ambulatory Visit: Payer: 59

## 2018-07-07 DIAGNOSIS — Z1231 Encounter for screening mammogram for malignant neoplasm of breast: Secondary | ICD-10-CM

## 2018-07-21 DIAGNOSIS — K601 Chronic anal fissure: Secondary | ICD-10-CM | POA: Diagnosis not present

## 2018-07-21 DIAGNOSIS — K625 Hemorrhage of anus and rectum: Secondary | ICD-10-CM | POA: Diagnosis not present

## 2018-07-21 DIAGNOSIS — E669 Obesity, unspecified: Secondary | ICD-10-CM | POA: Diagnosis not present

## 2018-07-29 DIAGNOSIS — M542 Cervicalgia: Secondary | ICD-10-CM | POA: Diagnosis not present

## 2018-07-29 DIAGNOSIS — M79671 Pain in right foot: Secondary | ICD-10-CM | POA: Diagnosis not present

## 2018-07-29 DIAGNOSIS — M545 Low back pain: Secondary | ICD-10-CM | POA: Diagnosis not present

## 2018-07-29 DIAGNOSIS — M79672 Pain in left foot: Secondary | ICD-10-CM | POA: Diagnosis not present

## 2018-08-01 DIAGNOSIS — M9905 Segmental and somatic dysfunction of pelvic region: Secondary | ICD-10-CM | POA: Diagnosis not present

## 2018-08-01 DIAGNOSIS — M9901 Segmental and somatic dysfunction of cervical region: Secondary | ICD-10-CM | POA: Diagnosis not present

## 2018-08-01 DIAGNOSIS — M9902 Segmental and somatic dysfunction of thoracic region: Secondary | ICD-10-CM | POA: Diagnosis not present

## 2018-08-01 DIAGNOSIS — M9903 Segmental and somatic dysfunction of lumbar region: Secondary | ICD-10-CM | POA: Diagnosis not present

## 2018-08-09 DIAGNOSIS — M9902 Segmental and somatic dysfunction of thoracic region: Secondary | ICD-10-CM | POA: Diagnosis not present

## 2018-08-09 DIAGNOSIS — M9903 Segmental and somatic dysfunction of lumbar region: Secondary | ICD-10-CM | POA: Diagnosis not present

## 2018-08-09 DIAGNOSIS — M9905 Segmental and somatic dysfunction of pelvic region: Secondary | ICD-10-CM | POA: Diagnosis not present

## 2018-08-09 DIAGNOSIS — M9901 Segmental and somatic dysfunction of cervical region: Secondary | ICD-10-CM | POA: Diagnosis not present

## 2018-08-16 ENCOUNTER — Encounter: Payer: Self-pay | Admitting: Internal Medicine

## 2018-08-18 ENCOUNTER — Other Ambulatory Visit: Payer: Self-pay | Admitting: Family Medicine

## 2018-08-18 DIAGNOSIS — M545 Low back pain, unspecified: Secondary | ICD-10-CM

## 2018-08-18 DIAGNOSIS — M542 Cervicalgia: Secondary | ICD-10-CM

## 2018-08-27 ENCOUNTER — Other Ambulatory Visit: Payer: BLUE CROSS/BLUE SHIELD

## 2018-09-06 DIAGNOSIS — M6289 Other specified disorders of muscle: Secondary | ICD-10-CM | POA: Diagnosis not present

## 2018-09-06 DIAGNOSIS — M461 Sacroiliitis, not elsewhere classified: Secondary | ICD-10-CM | POA: Diagnosis not present

## 2018-09-06 DIAGNOSIS — M62830 Muscle spasm of back: Secondary | ICD-10-CM | POA: Diagnosis not present

## 2018-09-14 ENCOUNTER — Ambulatory Visit
Admission: RE | Admit: 2018-09-14 | Discharge: 2018-09-14 | Disposition: A | Discharge status: Home / Self Care | Payer: BLUE CROSS/BLUE SHIELD | Source: Ambulatory Visit | Attending: Family Medicine | Admitting: Family Medicine

## 2018-09-14 ENCOUNTER — Other Ambulatory Visit: Payer: BLUE CROSS/BLUE SHIELD

## 2018-09-14 ENCOUNTER — Inpatient Hospital Stay: Admission: RE | Admit: 2018-09-14 | Payer: BLUE CROSS/BLUE SHIELD | Source: Ambulatory Visit

## 2018-09-14 DIAGNOSIS — M542 Cervicalgia: Secondary | ICD-10-CM

## 2018-09-14 DIAGNOSIS — M545 Low back pain, unspecified: Secondary | ICD-10-CM

## 2018-09-14 DIAGNOSIS — M5127 Other intervertebral disc displacement, lumbosacral region: Secondary | ICD-10-CM | POA: Diagnosis not present

## 2018-09-14 DIAGNOSIS — M4807 Spinal stenosis, lumbosacral region: Secondary | ICD-10-CM | POA: Diagnosis not present

## 2018-09-15 ENCOUNTER — Other Ambulatory Visit: Payer: BLUE CROSS/BLUE SHIELD

## 2018-09-15 DIAGNOSIS — M542 Cervicalgia: Secondary | ICD-10-CM | POA: Diagnosis not present

## 2018-09-20 DIAGNOSIS — M62830 Muscle spasm of back: Secondary | ICD-10-CM | POA: Diagnosis not present

## 2018-09-20 DIAGNOSIS — M461 Sacroiliitis, not elsewhere classified: Secondary | ICD-10-CM | POA: Diagnosis not present

## 2018-09-20 DIAGNOSIS — M6289 Other specified disorders of muscle: Secondary | ICD-10-CM | POA: Diagnosis not present

## 2018-09-21 DIAGNOSIS — M461 Sacroiliitis, not elsewhere classified: Secondary | ICD-10-CM | POA: Diagnosis not present

## 2018-09-21 DIAGNOSIS — M62830 Muscle spasm of back: Secondary | ICD-10-CM | POA: Diagnosis not present

## 2018-09-21 DIAGNOSIS — M6289 Other specified disorders of muscle: Secondary | ICD-10-CM | POA: Diagnosis not present

## 2018-09-27 DIAGNOSIS — M62830 Muscle spasm of back: Secondary | ICD-10-CM | POA: Diagnosis not present

## 2018-09-27 DIAGNOSIS — M6289 Other specified disorders of muscle: Secondary | ICD-10-CM | POA: Diagnosis not present

## 2018-09-27 DIAGNOSIS — M2560 Stiffness of unspecified joint, not elsewhere classified: Secondary | ICD-10-CM | POA: Diagnosis not present

## 2018-09-27 DIAGNOSIS — M461 Sacroiliitis, not elsewhere classified: Secondary | ICD-10-CM | POA: Diagnosis not present

## 2018-09-28 ENCOUNTER — Other Ambulatory Visit: Payer: Self-pay | Admitting: Internal Medicine

## 2018-09-29 DIAGNOSIS — M461 Sacroiliitis, not elsewhere classified: Secondary | ICD-10-CM | POA: Diagnosis not present

## 2018-09-29 DIAGNOSIS — M62830 Muscle spasm of back: Secondary | ICD-10-CM | POA: Diagnosis not present

## 2018-09-29 DIAGNOSIS — M2560 Stiffness of unspecified joint, not elsewhere classified: Secondary | ICD-10-CM | POA: Diagnosis not present

## 2018-09-29 DIAGNOSIS — M6289 Other specified disorders of muscle: Secondary | ICD-10-CM | POA: Diagnosis not present

## 2018-10-05 DIAGNOSIS — M2560 Stiffness of unspecified joint, not elsewhere classified: Secondary | ICD-10-CM | POA: Diagnosis not present

## 2018-10-05 DIAGNOSIS — M6289 Other specified disorders of muscle: Secondary | ICD-10-CM | POA: Diagnosis not present

## 2018-10-05 DIAGNOSIS — M461 Sacroiliitis, not elsewhere classified: Secondary | ICD-10-CM | POA: Diagnosis not present

## 2018-10-05 DIAGNOSIS — M62830 Muscle spasm of back: Secondary | ICD-10-CM | POA: Diagnosis not present

## 2018-10-14 ENCOUNTER — Other Ambulatory Visit: Payer: Self-pay

## 2018-10-14 ENCOUNTER — Encounter: Payer: Self-pay | Admitting: Internal Medicine

## 2018-10-14 ENCOUNTER — Ambulatory Visit (INDEPENDENT_AMBULATORY_CARE_PROVIDER_SITE_OTHER): Payer: BLUE CROSS/BLUE SHIELD | Admitting: Internal Medicine

## 2018-10-14 VITALS — Temp 98.5°F

## 2018-10-14 DIAGNOSIS — L659 Nonscarring hair loss, unspecified: Secondary | ICD-10-CM | POA: Diagnosis not present

## 2018-10-14 DIAGNOSIS — E7800 Pure hypercholesterolemia, unspecified: Secondary | ICD-10-CM | POA: Diagnosis not present

## 2018-10-14 DIAGNOSIS — I10 Essential (primary) hypertension: Secondary | ICD-10-CM

## 2018-10-14 DIAGNOSIS — Z712 Person consulting for explanation of examination or test findings: Secondary | ICD-10-CM

## 2018-10-14 DIAGNOSIS — Z8249 Family history of ischemic heart disease and other diseases of the circulatory system: Secondary | ICD-10-CM

## 2018-10-14 DIAGNOSIS — R0982 Postnasal drip: Secondary | ICD-10-CM

## 2018-10-14 DIAGNOSIS — Z7189 Other specified counseling: Secondary | ICD-10-CM

## 2018-10-14 MED ORDER — ROSUVASTATIN CALCIUM 20 MG PO TABS: ORAL_TABLET | ORAL | 1 refills | Status: AC

## 2018-10-14 MED ORDER — LEVOCETIRIZINE DIHYDROCHLORIDE 5 MG PO TABS: 5.0000 mg | ORAL_TABLET | Freq: Every evening | ORAL | 2 refills | Status: AC

## 2018-10-14 MED ORDER — VITAMIN D (ERGOCALCIFEROL) 1.25 MG (50000 UNIT) PO CAPS: ORAL_CAPSULE | ORAL | 2 refills | Status: AC

## 2018-10-14 MED ORDER — AMLODIPINE BESYLATE 5 MG PO TABS: 5.0000 mg | ORAL_TABLET | Freq: Every day | ORAL | 2 refills | Status: AC

## 2018-10-14 NOTE — Patient Instructions (Signed)

## 2018-10-16 ENCOUNTER — Encounter: Payer: Self-pay | Admitting: Internal Medicine

## 2018-10-16 NOTE — Progress Notes (Signed)
Virtual Visit via Video Note    Evaluation Performed:  Follow-up visit  This visit type was conducted due to national recommendations for restrictions regarding the COVID-19 Pandemic (e.g. social distancing).  This format is felt to be most appropriate for this patient at this time.  All issues noted in this document were discussed and addressed.  No physical exam was performed (except for noted visual exam findings with Video Visits).  Please refer to the patient's chart (MyChart message for video visits and phone note for telephone visits) for the patient's consent to telehealth for [facility].  Date:  10/16/2018   ID:  Theresa Rhodes, DOB 10-09-1960, MRN [id number]  Patient Location:  Home   Provider location:   Office    Chief Complaint:  Blood pressure check  History of Present Illness:    Theresa Rhodes is a 58 y.o. female who presents via audio/video conferencing for a telehealth visit today.   The patient does not have symptoms concerning for COVID-19 infection (fever, chills, cough, or new shortness of breath).   Hypertension  This is a chronic problem. The current episode started more than 1 year ago. The problem has been gradually improving since onset. The problem is controlled. Pertinent negatives include no blurred vision, chest pain, palpitations or shortness of breath. Risk factors for coronary artery disease include dyslipidemia and post-menopausal state.     Past Medical History:  Diagnosis Date  . Essential hypertension, benign   . Hypercholesterolemia    pure  . Hyperlipidemia    Past Surgical History:  Procedure Laterality Date  . NASAL SEPTUM SURGERY    . SALIVARY GLAND SURGERY  2012   sialoadenitis     Current Meds  Medication Sig  . amLODipine (NORVASC) 5 MG tablet Take 1 tablet (5 mg total) by mouth daily.  . [DISCONTINUED] amLODipine (NORVASC) 5 MG tablet Take 5 mg by mouth daily.  . [DISCONTINUED] aspirin EC 81 MG tablet Take 1 tablet (81 mg  total) by mouth daily.     Allergies:   Patient has no known allergies.   Social History   Tobacco Use  . Smoking status: Never Smoker  . Smokeless tobacco: Never Used  Substance Use Topics  . Alcohol use: Yes    Frequency: Never    Comment: occasional glass of wine  . Drug use: Never     Family Hx: The patient's family history includes Atrial fibrillation in her mother; COPD in her father; Heart attack in her mother; Heart disease in her mother; Hypertension in her father; Stroke in her mother.  ROS:   Please see the history of present illness.    Review of Systems  Constitutional: Negative.   Eyes: Negative for blurred vision.  Respiratory: Negative.  Negative for shortness of breath.   Cardiovascular: Negative.  Negative for chest pain and palpitations.  Gastrointestinal: Negative.   Neurological: Negative.   Psychiatric/Behavioral: Negative.     All other systems reviewed and are negative.   Labs/Other Tests and Data Reviewed:    Recent Labs: No results found for requested labs within last 8760 hours.   Recent Lipid Panel Lab Results  Component Value Date/Time   CHOL 268 (H) 04/22/2016 11:17 AM   TRIG 124 04/22/2016 11:17 AM   HDL 64 04/22/2016 11:17 AM   CHOLHDL 4.2 04/22/2016 11:17 AM   LDLCALC 179 (H) 04/22/2016 11:17 AM    Wt Readings from Last 3 Encounters:  04/22/16 182 lb (82.6 kg)  Exam:    Vital Signs:  Temp 98.5 F (36.9 C) Comment: pt provided  LMP 11/05/2010  Unable to get add'l vitals. She does not have scale nor bp cuff at home.    Physical Exam  Constitutional: She is well-developed, well-nourished, and in no distress.  HENT:  Head: Normocephalic and atraumatic.  Neck: Normal range of motion.  Pulmonary/Chest: Effort normal.  Neurological: She is alert.  Psychiatric: Affect normal.  Nursing note and vitals reviewed.   ASSESSMENT & PLAN:    1. Essential hypertension, benign  She reports controlled bp readings taken at  work. She will continue with current meds. She is encouraged to follow a low-sodium diet. Refills of amlodipine were sent to her pharmacy.   2. Pure hypercholesterolemia  I reviewed her previous lipid panel results. Her LDL was above 200.  She is now willing to start statin therapy since her cardiologist expressed her concerns (she had previously refused at my suggestion). She agrees to start rosuvastatin 20mg  with MWF dosing. She will rto in six weeks for f/u lipid testing. We discussed possible side effects in full detail.  3. Hair thinning  She will start taking biotin 5mg  once daily. I will check thyroid function at her next visit.   4. Postnasal drip  She was given refill of levocetirizine. She is advised to take daily.     COVID-19 Education: The signs and symptoms of COVID-19 were discussed with the patient and how to seek care for testing (follow up with PCP or arrange E-visit).  The importance of social distancing was discussed today.  Patient Risk:   After full review of this patients clinical status, I feel that they are at least moderate risk at this time due to her occupation and h/o htn.  Time:   Today, I have spent 26 minutes with the patient with telehealth technology discussing above diagnoses, her occupational risk and complications from COVID-19.     Medication Adjustments/Labs and Tests Ordered: Current medicines are reviewed at length with the patient today.  Concerns regarding medicines are outlined above.   Tests Ordered: No orders of the defined types were placed in this encounter.  Medication Changes: Meds ordered this encounter  Medications  . rosuvastatin (CRESTOR) 20 MG tablet    Sig: TAKE ONE TAB PO EVERY MON/WED/FRIDAY    Dispense:  90 tablet    Refill:  1  . amLODipine (NORVASC) 5 MG tablet    Sig: Take 1 tablet (5 mg total) by mouth daily.    Dispense:  90 tablet    Refill:  2  . Vitamin D, Ergocalciferol, (DRISDOL) 1.25 MG (50000 UT) CAPS  capsule    Sig: ONE CAPSULE PO TWICE WEEKLY ON TUES/FRIDAYS    Dispense:  8 capsule    Refill:  2  . levocetirizine (XYZAL) 5 MG tablet    Sig: Take 1 tablet (5 mg total) by mouth every evening.    Dispense:  90 tablet    Refill:  2    Disposition:  Follow up in 6 week(s)  Signed, Gwynneth Aliment, MD

## 2018-10-25 ENCOUNTER — Encounter: Payer: Self-pay | Admitting: Internal Medicine

## 2018-11-24 ENCOUNTER — Ambulatory Visit: Payer: BLUE CROSS/BLUE SHIELD | Admitting: Internal Medicine

## 2019-01-31 ENCOUNTER — Encounter: Payer: BC Managed Care – PPO | Admitting: Internal Medicine

## 2019-01-31 ENCOUNTER — Telehealth: Payer: Self-pay | Admitting: Internal Medicine

## 2019-01-31 NOTE — Telephone Encounter (Signed)
ATT TO CONTACT PT ABOUT APPT UNABLE TO CONTACT OR LVM MAILBOX FULL

## 2019-02-08 DIAGNOSIS — R7309 Other abnormal glucose: Secondary | ICD-10-CM | POA: Diagnosis not present

## 2019-02-08 DIAGNOSIS — Z20828 Contact with and (suspected) exposure to other viral communicable diseases: Secondary | ICD-10-CM | POA: Diagnosis not present

## 2019-02-08 DIAGNOSIS — J309 Allergic rhinitis, unspecified: Secondary | ICD-10-CM | POA: Diagnosis not present

## 2019-02-08 DIAGNOSIS — E559 Vitamin D deficiency, unspecified: Secondary | ICD-10-CM | POA: Diagnosis not present

## 2019-02-08 DIAGNOSIS — E782 Mixed hyperlipidemia: Secondary | ICD-10-CM | POA: Diagnosis not present

## 2019-02-08 DIAGNOSIS — I10 Essential (primary) hypertension: Secondary | ICD-10-CM | POA: Diagnosis not present

## 2019-05-15 DIAGNOSIS — Z0000 Encounter for general adult medical examination without abnormal findings: Secondary | ICD-10-CM | POA: Diagnosis not present

## 2019-05-15 DIAGNOSIS — E669 Obesity, unspecified: Secondary | ICD-10-CM | POA: Diagnosis not present

## 2019-05-15 DIAGNOSIS — I10 Essential (primary) hypertension: Secondary | ICD-10-CM | POA: Diagnosis not present

## 2019-05-15 DIAGNOSIS — E782 Mixed hyperlipidemia: Secondary | ICD-10-CM | POA: Diagnosis not present

## 2019-08-07 DIAGNOSIS — R350 Frequency of micturition: Secondary | ICD-10-CM | POA: Diagnosis not present

## 2019-08-07 DIAGNOSIS — Z1151 Encounter for screening for human papillomavirus (HPV): Secondary | ICD-10-CM | POA: Diagnosis not present

## 2019-08-07 DIAGNOSIS — Z01419 Encounter for gynecological examination (general) (routine) without abnormal findings: Secondary | ICD-10-CM | POA: Diagnosis not present

## 2019-08-07 DIAGNOSIS — Z6832 Body mass index (BMI) 32.0-32.9, adult: Secondary | ICD-10-CM | POA: Diagnosis not present

## 2019-08-10 DIAGNOSIS — R350 Frequency of micturition: Secondary | ICD-10-CM | POA: Diagnosis not present

## 2019-08-18 DIAGNOSIS — Z713 Dietary counseling and surveillance: Secondary | ICD-10-CM | POA: Diagnosis not present

## 2019-09-01 DIAGNOSIS — Z713 Dietary counseling and surveillance: Secondary | ICD-10-CM | POA: Diagnosis not present

## 2019-09-14 DIAGNOSIS — Z713 Dietary counseling and surveillance: Secondary | ICD-10-CM | POA: Diagnosis not present

## 2019-10-02 DIAGNOSIS — D259 Leiomyoma of uterus, unspecified: Secondary | ICD-10-CM | POA: Diagnosis not present

## 2019-12-07 DIAGNOSIS — R3915 Urgency of urination: Secondary | ICD-10-CM | POA: Diagnosis not present

## 2020-01-04 ENCOUNTER — Other Ambulatory Visit (HOSPITAL_COMMUNITY)
Admission: RE | Admit: 2020-01-04 | Discharge: 2020-01-04 | Disposition: A | Discharge status: Home / Self Care | Payer: BC Managed Care – PPO | Source: Ambulatory Visit | Attending: General Surgery | Admitting: General Surgery

## 2020-01-04 DIAGNOSIS — Z20822 Contact with and (suspected) exposure to covid-19: Secondary | ICD-10-CM | POA: Insufficient documentation

## 2020-01-04 DIAGNOSIS — Z01812 Encounter for preprocedural laboratory examination: Secondary | ICD-10-CM | POA: Diagnosis not present

## 2020-01-04 LAB — SARS CORONAVIRUS 2 BY RT PCR (HOSPITAL ORDER, PERFORMED IN ~~LOC~~ HOSPITAL LAB): SARS Coronavirus 2: NEGATIVE

## 2020-02-21 ENCOUNTER — Other Ambulatory Visit: Payer: Self-pay | Admitting: Internal Medicine

## 2020-02-21 DIAGNOSIS — Z1231 Encounter for screening mammogram for malignant neoplasm of breast: Secondary | ICD-10-CM

## 2020-02-22 ENCOUNTER — Other Ambulatory Visit: Payer: Self-pay

## 2020-02-22 ENCOUNTER — Ambulatory Visit
Admission: RE | Admit: 2020-02-22 | Discharge: 2020-02-22 | Disposition: A | Discharge status: Home / Self Care | Payer: BC Managed Care – PPO | Source: Ambulatory Visit | Attending: Internal Medicine | Admitting: Internal Medicine

## 2020-02-22 DIAGNOSIS — Z1231 Encounter for screening mammogram for malignant neoplasm of breast: Secondary | ICD-10-CM

## 2020-02-23 ENCOUNTER — Other Ambulatory Visit: Payer: Self-pay | Admitting: Internal Medicine

## 2020-02-23 DIAGNOSIS — R928 Other abnormal and inconclusive findings on diagnostic imaging of breast: Secondary | ICD-10-CM

## 2020-02-26 ENCOUNTER — Other Ambulatory Visit: Payer: Self-pay | Admitting: Internal Medicine

## 2020-02-26 ENCOUNTER — Ambulatory Visit
Admission: RE | Admit: 2020-02-26 | Discharge: 2020-02-26 | Disposition: A | Discharge status: Home / Self Care | Payer: BC Managed Care – PPO | Source: Ambulatory Visit | Attending: Internal Medicine | Admitting: Internal Medicine

## 2020-02-26 ENCOUNTER — Other Ambulatory Visit: Payer: Self-pay

## 2020-02-26 DIAGNOSIS — R928 Other abnormal and inconclusive findings on diagnostic imaging of breast: Secondary | ICD-10-CM

## 2020-02-26 DIAGNOSIS — N6310 Unspecified lump in the right breast, unspecified quadrant: Secondary | ICD-10-CM

## 2020-02-26 DIAGNOSIS — N6315 Unspecified lump in the right breast, overlapping quadrants: Secondary | ICD-10-CM | POA: Diagnosis not present

## 2020-02-27 ENCOUNTER — Ambulatory Visit
Admission: RE | Admit: 2020-02-27 | Discharge: 2020-02-27 | Disposition: A | Discharge status: Home / Self Care | Payer: BC Managed Care – PPO | Source: Ambulatory Visit | Attending: Internal Medicine | Admitting: Internal Medicine

## 2020-02-27 DIAGNOSIS — N6315 Unspecified lump in the right breast, overlapping quadrants: Secondary | ICD-10-CM | POA: Diagnosis not present

## 2020-02-27 DIAGNOSIS — N6011 Diffuse cystic mastopathy of right breast: Secondary | ICD-10-CM | POA: Diagnosis not present

## 2020-02-27 DIAGNOSIS — N6310 Unspecified lump in the right breast, unspecified quadrant: Secondary | ICD-10-CM

## 2020-02-27 HISTORY — PX: BREAST BIOPSY: SHX20

## 2020-03-20 DIAGNOSIS — H538 Other visual disturbances: Secondary | ICD-10-CM | POA: Diagnosis not present

## 2020-03-20 DIAGNOSIS — H52223 Regular astigmatism, bilateral: Secondary | ICD-10-CM | POA: Diagnosis not present

## 2020-03-20 DIAGNOSIS — H40003 Preglaucoma, unspecified, bilateral: Secondary | ICD-10-CM | POA: Diagnosis not present

## 2020-03-20 DIAGNOSIS — H5213 Myopia, bilateral: Secondary | ICD-10-CM | POA: Diagnosis not present

## 2020-09-02 ENCOUNTER — Other Ambulatory Visit: Payer: BC Managed Care – PPO

## 2021-01-23 IMAGING — MR MR CERVICAL SPINE W/O CM
5 series · 30 of 48 positions shown · non-contrast
Comparison: None.

CLINICAL DATA: Neck pain

EXAM:
MRI CERVICAL SPINE WITHOUT CONTRAST
TECHNIQUE: Multiplanar, multisequence MR imaging of the cervical spine was
performed. No intravenous contrast was administered.

[Series 2: T2 · sagittal · 3.0mm · 0.41mm/px · 6 of 13 slices shown (1 of 2)]
[im 1/13]
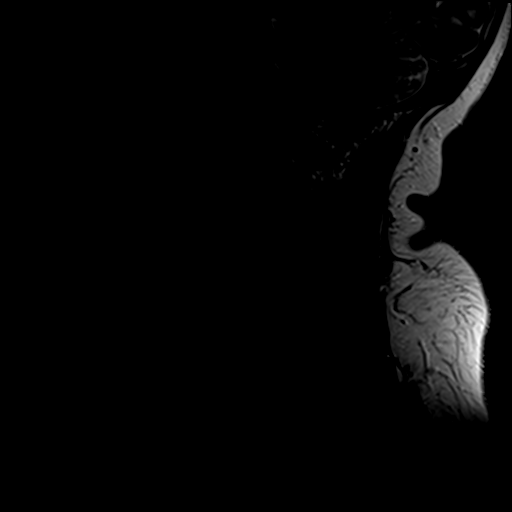
[im 3/13]
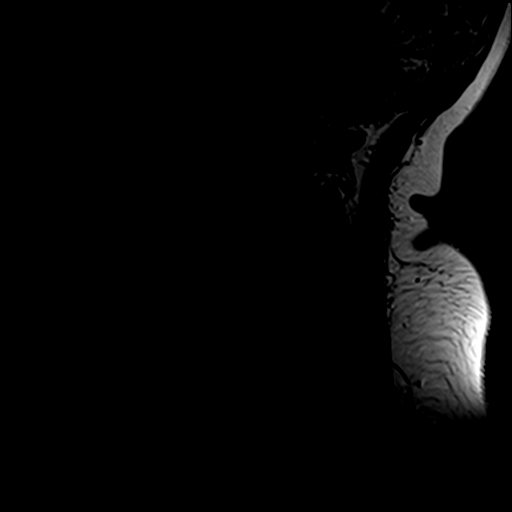
[im 5/13]
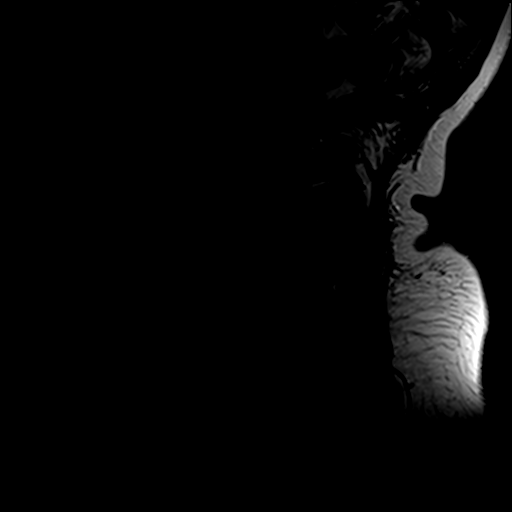
[im 8/13]
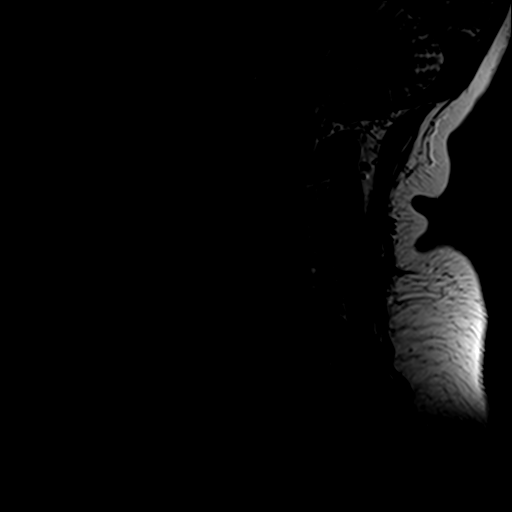
[im 10/13]
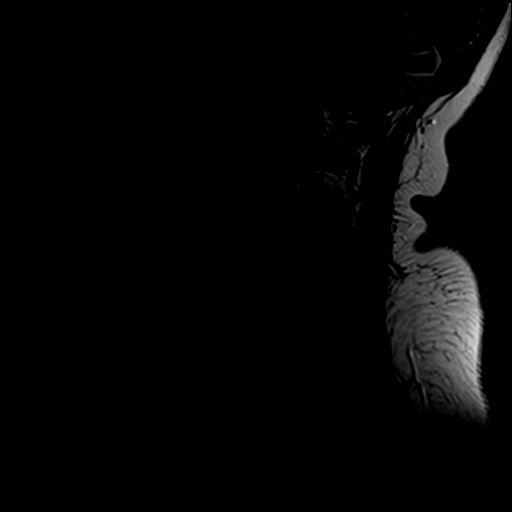
[im 13/13]
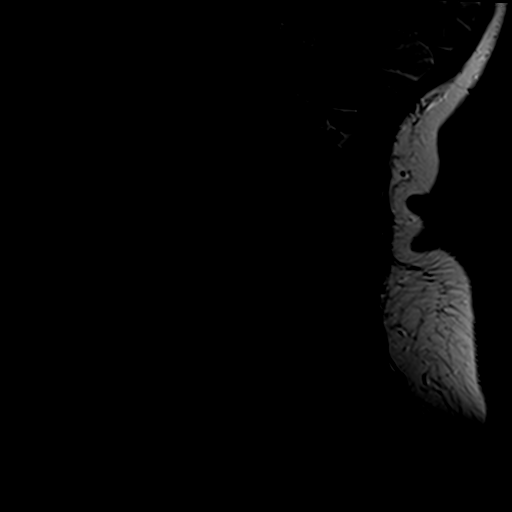

[Series 3: T1 · sagittal · 3.0mm · 0.41mm/px · 7 of 13 slices shown]
[im 1/13]
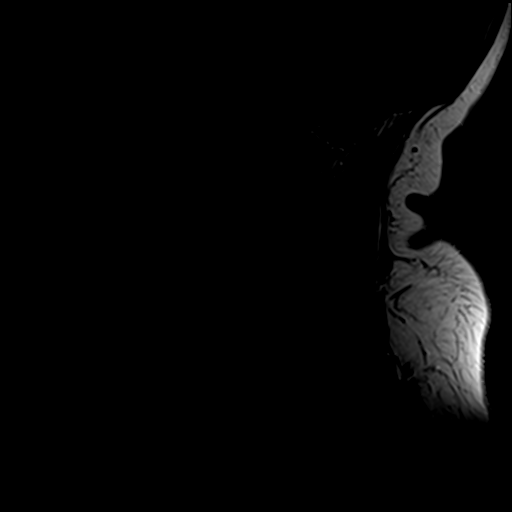
[im 3/13]
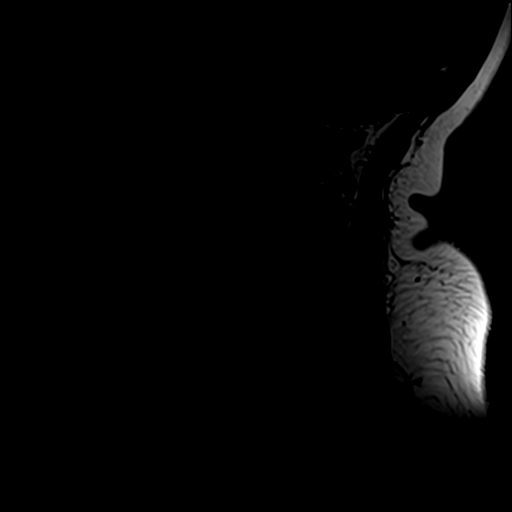
[im 5/13]
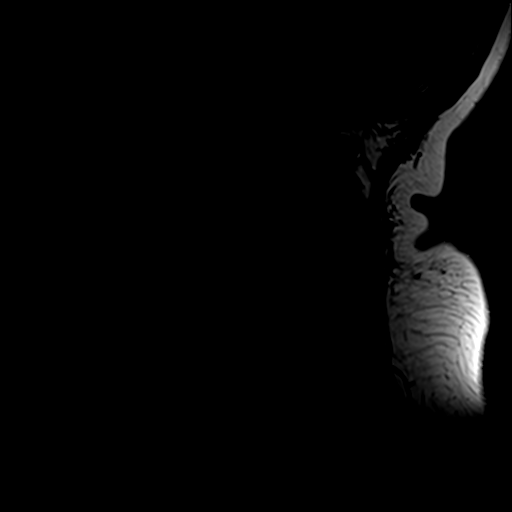
[im 7/13]
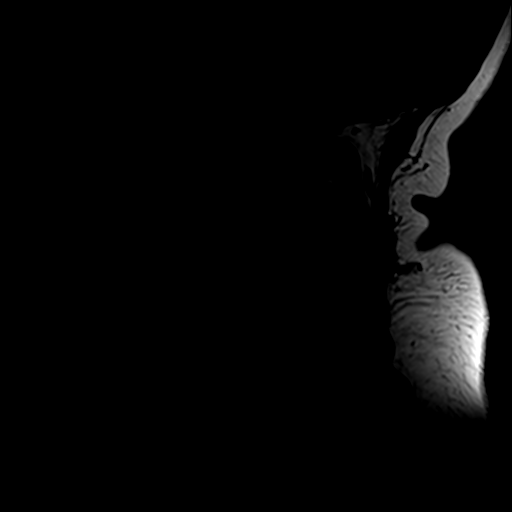
[im 9/13]
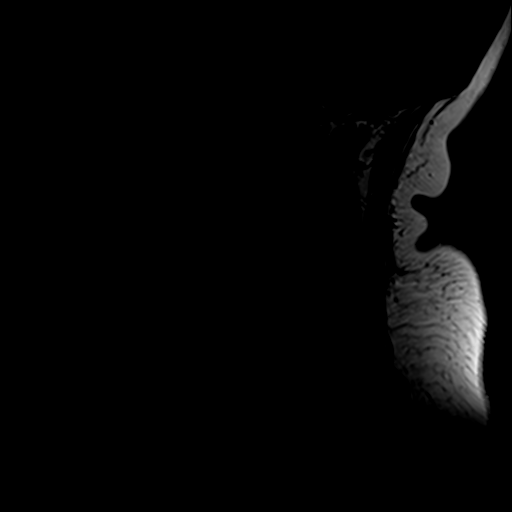
[im 11/13]
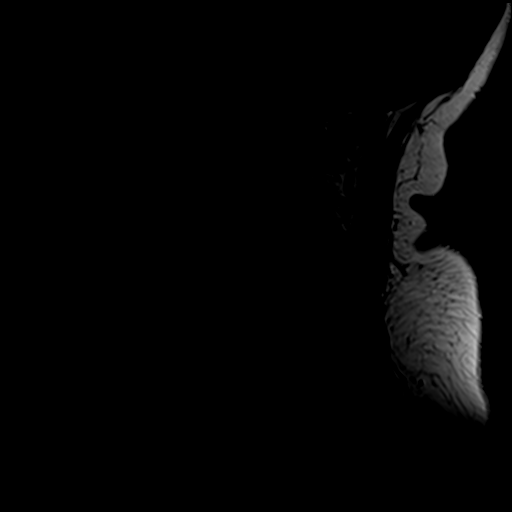
[im 13/13]
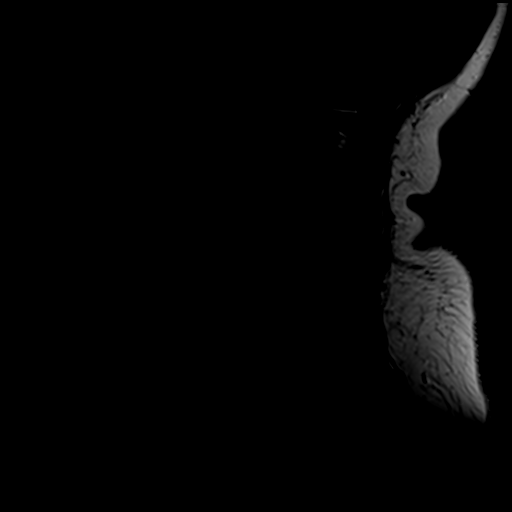

[Series 4: STIR · sagittal · 3.0mm · 0.82mm/px · 7 of 13 slices shown]
[im 1/13]
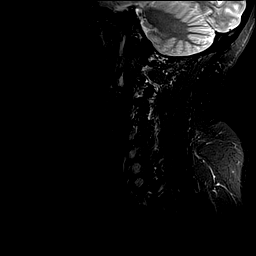
[im 3/13]
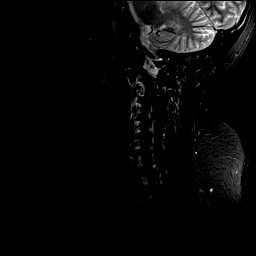
[im 5/13]
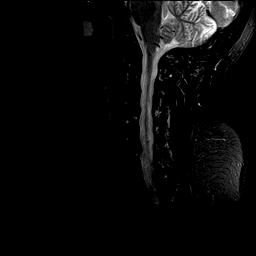
[im 7/13]
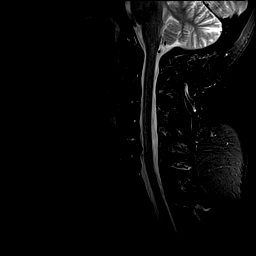
[im 9/13]
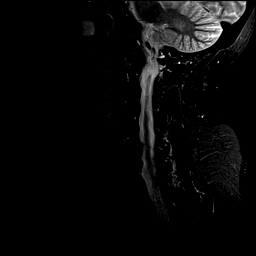
[im 11/13]
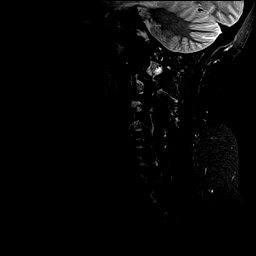
[im 13/13]
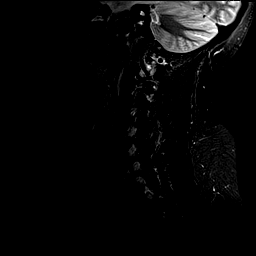

[Series 5: GRE · axial · 3.0mm · 0.35mm/px · z∈[-56,-45]mm · 2 of 26 slices shown]
[im 1/26]
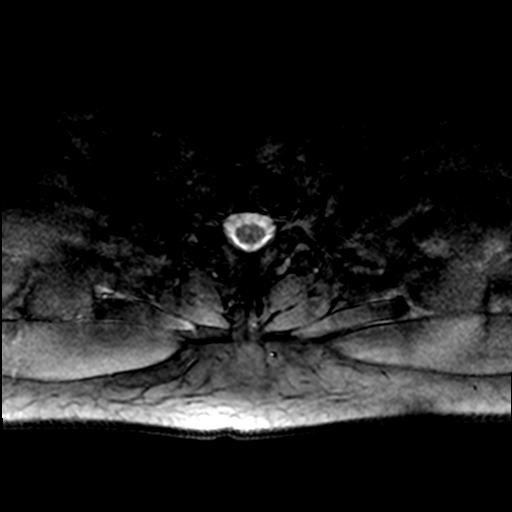
[im 4/26]
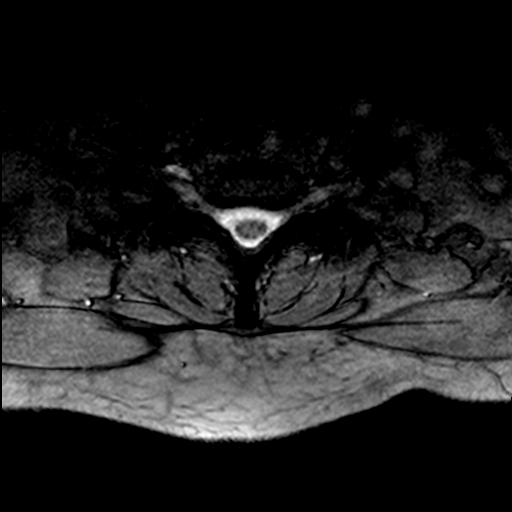

[Series 6: T2 · axial · 3.0mm · 0.70mm/px · z∈[-56,+35]mm · 8 of 26 slices shown (2 of 2)]
[im 1/26]
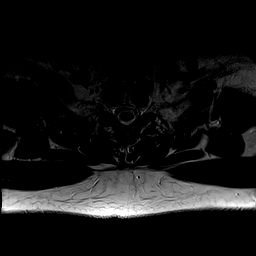
[im 4/26]
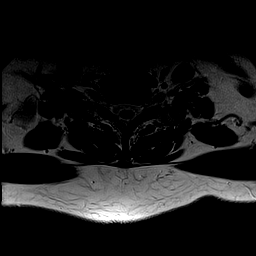
[im 8/26]
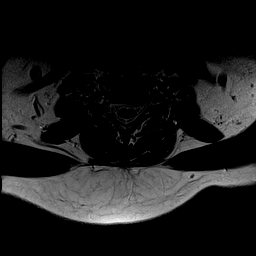
[im 12/26]
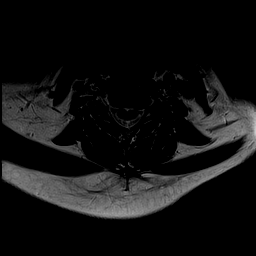
[im 14/26]
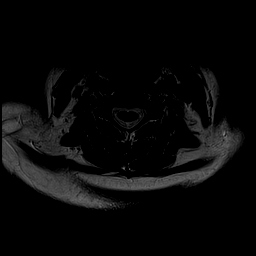
[im 18/26]
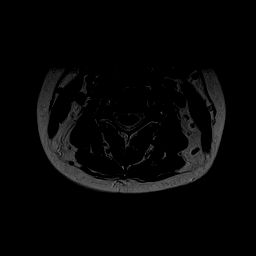
[im 22/26]
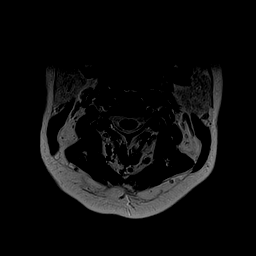
[im 26/26]
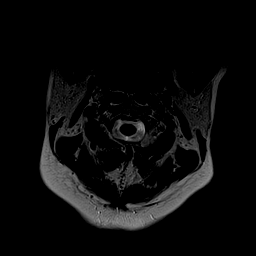

[30 of 48 positions shown; findings below may reference images not displayed]

FINDINGS: Alignment: Normal

Vertebrae: Negative for fracture or mass.

Cord: Negative for cord compression. No cord lesion. Normal spinal
cord signal.

Posterior Fossa, vertebral arteries, paraspinal tissues: Negative

Disc levels:

C2-3: Negative

C3-4: Negative

C4-5: Negative

C5-6: Mild disc degeneration and uncinate spurring. Mild left
foraminal narrowing due to spurring.

C6-7: Mild left foraminal narrowing due to uncinate spurring.

C7-T1: Negative
IMPRESSION: Mild left foraminal narrowing C5-6 and C6-7 due to uncinate
spurring. Otherwise no significant stenosis.

## 2021-01-29 ENCOUNTER — Other Ambulatory Visit (HOSPITAL_COMMUNITY)
Admission: RE | Admit: 2021-01-29 | Discharge: 2021-01-29 | Disposition: A | Discharge status: Home / Self Care | Payer: BC Managed Care – PPO | Source: Ambulatory Visit | Attending: General Surgery | Admitting: General Surgery

## 2021-01-29 DIAGNOSIS — Z20822 Contact with and (suspected) exposure to covid-19: Secondary | ICD-10-CM | POA: Diagnosis not present

## 2021-01-29 LAB — SARS CORONAVIRUS 2 BY RT PCR (HOSPITAL ORDER, PERFORMED IN ~~LOC~~ HOSPITAL LAB): SARS Coronavirus 2: NEGATIVE

## 2021-05-21 ENCOUNTER — Other Ambulatory Visit: Payer: Self-pay | Admitting: Internal Medicine

## 2021-05-21 DIAGNOSIS — Z1231 Encounter for screening mammogram for malignant neoplasm of breast: Secondary | ICD-10-CM

## 2021-06-27 ENCOUNTER — Ambulatory Visit: Payer: BC Managed Care – PPO

## 2021-07-25 ENCOUNTER — Ambulatory Visit
Admission: RE | Admit: 2021-07-25 | Discharge: 2021-07-25 | Disposition: A | Discharge status: Home / Self Care | Payer: BC Managed Care – PPO | Source: Ambulatory Visit | Attending: Internal Medicine | Admitting: Internal Medicine

## 2021-07-25 ENCOUNTER — Other Ambulatory Visit: Payer: Self-pay

## 2021-07-25 DIAGNOSIS — Z1231 Encounter for screening mammogram for malignant neoplasm of breast: Secondary | ICD-10-CM

## 2022-10-15 ENCOUNTER — Other Ambulatory Visit: Payer: Self-pay | Admitting: Internal Medicine

## 2022-10-15 DIAGNOSIS — Z1231 Encounter for screening mammogram for malignant neoplasm of breast: Secondary | ICD-10-CM

## 2022-11-18 ENCOUNTER — Ambulatory Visit
Admission: RE | Admit: 2022-11-18 | Discharge: 2022-11-18 | Disposition: A | Discharge status: Home / Self Care | Payer: BC Managed Care – PPO | Source: Ambulatory Visit | Attending: Internal Medicine | Admitting: Internal Medicine

## 2022-11-18 DIAGNOSIS — Z1231 Encounter for screening mammogram for malignant neoplasm of breast: Secondary | ICD-10-CM

## 2023-01-21 ENCOUNTER — Other Ambulatory Visit (HOSPITAL_BASED_OUTPATIENT_CLINIC_OR_DEPARTMENT_OTHER): Payer: Self-pay | Admitting: Internal Medicine

## 2023-01-21 DIAGNOSIS — E782 Mixed hyperlipidemia: Secondary | ICD-10-CM

## 2023-02-08 ENCOUNTER — Ambulatory Visit (HOSPITAL_COMMUNITY)
Admission: RE | Admit: 2023-02-08 | Discharge: 2023-02-08 | Disposition: A | Discharge status: Home / Self Care | Payer: BC Managed Care – PPO | Source: Ambulatory Visit | Attending: Internal Medicine | Admitting: Internal Medicine

## 2023-02-08 DIAGNOSIS — E782 Mixed hyperlipidemia: Secondary | ICD-10-CM | POA: Insufficient documentation

## 2023-07-16 ENCOUNTER — Other Ambulatory Visit: Payer: Self-pay | Admitting: Internal Medicine

## 2023-07-16 ENCOUNTER — Ambulatory Visit
Admission: RE | Admit: 2023-07-16 | Discharge: 2023-07-16 | Disposition: A | Discharge status: Home / Self Care | Payer: BC Managed Care – PPO | Source: Ambulatory Visit | Attending: Internal Medicine | Admitting: Internal Medicine

## 2023-07-16 DIAGNOSIS — R053 Chronic cough: Secondary | ICD-10-CM

## 2023-11-18 ENCOUNTER — Encounter: Payer: Self-pay | Admitting: Allergy

## 2023-11-18 ENCOUNTER — Ambulatory Visit: Admitting: Allergy

## 2023-11-18 ENCOUNTER — Other Ambulatory Visit: Payer: Self-pay

## 2023-11-18 VITALS — BP 120/80 | HR 82 | Temp 98.4°F | Resp 18 | Ht 64.57 in | Wt 209.7 lb

## 2023-11-18 DIAGNOSIS — J310 Chronic rhinitis: Secondary | ICD-10-CM | POA: Diagnosis not present

## 2023-11-18 MED ORDER — IPRATROPIUM BROMIDE 0.06 % NA SOLN: 2.0000 | Freq: Four times a day (QID) | NASAL | 5 refills | Status: AC | PRN

## 2023-11-18 NOTE — Progress Notes (Signed)
 New Patient Note  RE: Theresa Rhodes MRN: [id number] DOB: 10-Jul-1961 Date of Office Visit: 11/18/2023  Primary care provider: Yolanda Hence, MD  Chief Complaint: Cough/allergies  History of present illness: Theresa Rhodes is a 63 y.o. female presenting today for evaluation of allergic rhinitis. Discussed the use of AI scribe software for clinical note transcription with the patient, who gave verbal consent to proceed.  She has noted more seemingly allergy symptoms over the past 5 years than previous.  She has experienced a chronic cough for several months last year. The cough was persistent, occurring frequently throughout the day and night, and was severe enough to disturb her sleep. It was not alleviated by stopping coffee consumption. Despite using an albuterol inhaler, the cough persisted. Pulmonary function tests and a chest X-ray were normal.  Nothing she tried to help with the cough made much of a difference.  She drank ice water and elevated her head during sleep to alleviate symptoms.  However the cough did go away and currently she has been asymptomatic.  She does not recall having any illnesses prior to the onset of this cough and denies having COVID.  She does note that she has had some weight gain and does feel that this has contributed to her overall breathing ability.  She recently started a GLP-1 to aid in weight loss.  In the past eight months, she experienced a severe epistaxis that required cauterization. Her daughter has experienced similar issues. She carries Afrin as a precaution now in case of nosebleeds.  She denies having significant sneezing, itchy or watery eyes, runny or stuffy nose, and throat clearing.  Various treatments, including Xyzal , Claritin, Zyrtec, Flonase, Nasacort, and Benadryl, were ineffective.   She has a history of vitiligo and she has seen a dermatologist.  She reports no history of eczema or food allergies, although she  experiences coughing when consuming cashews and pistachios.  She attributed this may be to the shell/dust particles on the coating of the nuts.  She tolerates eating other tree nuts and peanuts.  Her son has history of asthma asthma.  Review of systems: 10pt ROS negative unless noted above in HPI  Past medical history: Past Medical History:  Diagnosis Date   Essential hypertension, benign    Hypercholesterolemia    pure   Hyperlipidemia     Past surgical history: Past Surgical History:  Procedure Laterality Date   BREAST BIOPSY     NASAL SEPTUM SURGERY     SALIVARY GLAND SURGERY  2012   sialoadenitis    Family history:  Family History  Problem Relation Age of Onset   Heart disease Mother    Atrial fibrillation Mother    Stroke Mother    Heart attack Mother    COPD Father        non-smoker   Hypertension Father     Social history: Lives in a home without carpeting with gas heating and cooling.  Dog in the home.  There is no concern for water damage, mildew or roaches in the home.  She is a retired Designer, industrial/product.  She denies a smoking history.   Medication List: Current Outpatient Medications  Medication Sig Dispense Refill   amLODipine  (NORVASC ) 5 MG tablet Take 1 tablet (5 mg total) by mouth daily. 90 tablet 2   ipratropium (ATROVENT) 0.06 % nasal spray Place 2 sprays into both nostrils 4 (four) times daily as needed for rhinitis. 15 mL 5   levocetirizine (XYZAL ) 5  MG tablet Take 1 tablet (5 mg total) by mouth every evening. 90 tablet 2   rosuvastatin  (CRESTOR ) 20 MG tablet TAKE ONE TAB PO EVERY MON/WED/FRIDAY 90 tablet 1   Vitamin D , Ergocalciferol , (DRISDOL ) 1.25 MG (50000 UT) CAPS capsule ONE CAPSULE PO TWICE WEEKLY ON TUES/FRIDAYS 8 capsule 2   No current facility-administered medications for this visit.    Known medication allergies: No Known Allergies   Physical examination: Blood pressure 120/80, pulse 82, temperature 98.4 F (36.9 C), temperature  source Temporal, resp. rate 18, height 5' 4.57" (1.64 m), weight 209 lb 11.2 oz (95.1 kg), last menstrual period 11/05/2010, SpO2 97%.  General: Alert, interactive, in no acute distress. HEENT: PERRLA, TMs pearly gray, turbinates mildly edematous with clear discharge, post-pharynx non erythematous. Neck: Supple without lymphadenopathy. Lungs: Clear to auscultation without wheezing, rhonchi or rales. {no increased work of breathing. CV: Normal S1, S2 without murmurs. Abdomen: Nondistended, nontender. Skin: Warm and dry, without lesions or rashes. Extremities:  No clubbing, cyanosis or edema. Neuro:   Grossly intact.  Diagnositics/Labs: None today  Assessment and plan:   Rhinitis Chronic cough resolved, but history suggests sinus drainage.  Nasal steroid sprays and antihistamines ineffective.  Atrovent nasal spray recommended for potential drainage management. - Prescribe Atrovent nasal spray 2 sprays each nostril as needed, up to four times daily. With using nasal sprays point tip of bottle toward eye on same side nostril and lean head slightly forward for best technique.   - Consider antihistamine like carbinoxamine in the future if needed - Consider allergy testing if symptoms persist despite nasal spray use.  Follow-up as needed   I appreciate the opportunity to take part in Nilza's care. Please do not hesitate to contact me with questions.  Sincerely,   Catha Clink, MD Allergy/Immunology [facility]

## 2023-11-18 NOTE — Patient Instructions (Signed)
 Rhinitis Chronic cough resolved, but history suggests sinus drainage.  Nasal steroid sprays and antihistamines ineffective.  Atrovent nasal spray recommended for potential drainage management. - Prescribe Atrovent nasal spray 2 sprays each nostril as needed, up to four times daily. With using nasal sprays point tip of bottle toward eye on same side nostril and lean head slightly forward for best technique.   - Consider antihistamine like carbinoxamine in the future if needed - Consider allergy testing if symptoms persist despite nasal spray use.  Follow-up as needed

## 2023-11-24 ENCOUNTER — Other Ambulatory Visit: Payer: Self-pay | Admitting: Internal Medicine

## 2023-11-24 DIAGNOSIS — Z1231 Encounter for screening mammogram for malignant neoplasm of breast: Secondary | ICD-10-CM

## 2023-12-01 ENCOUNTER — Ambulatory Visit
Admission: RE | Admit: 2023-12-01 | Discharge: 2023-12-01 | Disposition: A | Discharge status: Home / Self Care | Source: Ambulatory Visit | Attending: Internal Medicine | Admitting: Internal Medicine

## 2023-12-01 DIAGNOSIS — Z1231 Encounter for screening mammogram for malignant neoplasm of breast: Secondary | ICD-10-CM
